# Patient Record
Sex: Male | Born: 1965 | Race: White | Hispanic: No | Marital: Married | Smoking: Never smoker
Health system: Southern US, Community
[De-identification: ages and names within clinical notes are randomized; demographics above are authoritative.]

## PROBLEM LIST (undated history)

## (undated) DIAGNOSIS — E039 Hypothyroidism, unspecified: Secondary | ICD-10-CM

## (undated) DIAGNOSIS — E079 Disorder of thyroid, unspecified: Secondary | ICD-10-CM

## (undated) HISTORY — PX: TONSILLECTOMY: SUR1361

## (undated) HISTORY — PX: ACHILLES TENDON REPAIR: SUR1153

## (undated) HISTORY — PX: APPENDECTOMY: SHX54

## (undated) HISTORY — PX: NASAL SEPTUM SURGERY: SHX37

## (undated) HISTORY — PX: SHOULDER SURGERY: SHX246

---

## 2014-10-22 ENCOUNTER — Emergency Department (HOSPITAL_BASED_OUTPATIENT_CLINIC_OR_DEPARTMENT_OTHER): Payer: Federal, State, Local not specified - PPO

## 2014-10-22 ENCOUNTER — Encounter (HOSPITAL_BASED_OUTPATIENT_CLINIC_OR_DEPARTMENT_OTHER): Payer: Self-pay | Admitting: Emergency Medicine

## 2014-10-22 ENCOUNTER — Emergency Department (HOSPITAL_BASED_OUTPATIENT_CLINIC_OR_DEPARTMENT_OTHER)
Admission: EM | Admit: 2014-10-22 | Discharge: 2014-10-22 | Disposition: A | Discharge status: Home / Self Care | Payer: Federal, State, Local not specified - PPO | Attending: Emergency Medicine | Admitting: Emergency Medicine

## 2014-10-22 DIAGNOSIS — T1490XA Injury, unspecified, initial encounter: Secondary | ICD-10-CM

## 2014-10-22 DIAGNOSIS — S80212A Abrasion, left knee, initial encounter: Secondary | ICD-10-CM | POA: Insufficient documentation

## 2014-10-22 DIAGNOSIS — E079 Disorder of thyroid, unspecified: Secondary | ICD-10-CM | POA: Diagnosis not present

## 2014-10-22 DIAGNOSIS — Y92410 Unspecified street and highway as the place of occurrence of the external cause: Secondary | ICD-10-CM | POA: Diagnosis not present

## 2014-10-22 DIAGNOSIS — S80211A Abrasion, right knee, initial encounter: Secondary | ICD-10-CM | POA: Insufficient documentation

## 2014-10-22 DIAGNOSIS — Y9389 Activity, other specified: Secondary | ICD-10-CM | POA: Insufficient documentation

## 2014-10-22 DIAGNOSIS — S4992XA Unspecified injury of left shoulder and upper arm, initial encounter: Secondary | ICD-10-CM | POA: Diagnosis present

## 2014-10-22 DIAGNOSIS — S43102A Unspecified dislocation of left acromioclavicular joint, initial encounter: Secondary | ICD-10-CM

## 2014-10-22 DIAGNOSIS — Y998 Other external cause status: Secondary | ICD-10-CM | POA: Insufficient documentation

## 2014-10-22 DIAGNOSIS — Z79899 Other long term (current) drug therapy: Secondary | ICD-10-CM | POA: Diagnosis not present

## 2014-10-22 DIAGNOSIS — Z880 Allergy status to penicillin: Secondary | ICD-10-CM | POA: Insufficient documentation

## 2014-10-22 HISTORY — DX: Disorder of thyroid, unspecified: E07.9

## 2014-10-22 MED ORDER — FENTANYL CITRATE (PF) 100 MCG/2ML IJ SOLN
50.0000 ug | Freq: Once | INTRAMUSCULAR | Status: AC
  Administered 2014-10-22: 50 ug via INTRAVENOUS
  Filled 2014-10-22: qty 2

## 2014-10-22 MED ORDER — IBUPROFEN 800 MG PO TABS: 800.0000 mg | ORAL_TABLET | Freq: Three times a day (TID) | ORAL | Status: AC

## 2014-10-22 NOTE — Discharge Instructions (Signed)
Acromioclavicular Separation with Rehab The acromioclavicular joint is the joint between the roof of the shoulder (acromion) and the collarbone (clavicle). It is vulnerable to injury. An acromioclavicular (AC) separation is a partial or complete tear (sprain), injury, or redness and soreness (inflammation) of the ligaments that cross the acromioclavicular joint and hold it in place. There are two ligaments in this area that are vulnerable to injury, the acromioclavicular ligament and the coracoclavicular ligament. SYMPTOMS   Tenderness and swelling, or a bump on top of the shoulder (at the AC joint).  Bruising (contusion) in the area within 48 hours of injury.  Loss of strength or pain when reaching over the head or across the body. CAUSES  AC separation is caused by direct trauma to the joint (falling on your shoulder) or indirect trauma (falling on an outstretched arm). RISK INCREASES WITH:  Sports that require contact or collision, throwing sports (i.e. racquetball, squash).  Poor strength and flexibility.  Previous shoulder sprain or dislocation.  Poorly fitted or padded protective equipment. PREVENTION   Warm-up and stretch properly before activity.  Maintain physical fitness:  Shoulder strength.  Shoulder flexibility.  Cardiovascular fitness.  Wear properly fitted and padded protective equipment.  Learn and use proper technique when playing sports. Have a coach correct improper technique, including falling and landing.  Apply taping, protective strapping or padding, or an adhesive bandage as recommended before practice or competition. PROGNOSIS   If treated properly, the symptoms of AC separation can be expected to go away.  If treated improperly, permanent disability may occur unless surgery is performed.  Healing time varies with type of sport and position, arm injured (dominant versus non-dominant) and severity of sprain. RELATED COMPLICATIONS  Weakness and  fatigue of the arm or shoulder are possible but uncommon.  Pain and inflammation of the AC joint may continue.  Prolonged healing time may be necessary if usual activities are resumed too early. This causes a susceptibility to recurrent injury.  Prolonged disability may occur.  The shoulder may remain unstable or arthritic following repeated injury. TREATMENT  Treatment initially involves ice and medication to help reduce pain and inflammation. It may also be necessary to modify your activities in order to prevent further injury. Both non-surgical and surgical interventions exist to treat AC separation. Non-surgical intervention is usually recommended and involves wearing a sling to immobilize the joint for a period of time to allow for healing. Surgical intervention is usually only considered for severe sprains of the ligament or for individuals who do not improve after 2 to 6 months of non-surgical treatment. Surgical interventions require 4 to 6 months before a return to sports is possible. MEDICATION  If pain medication is necessary, nonsteroidal anti-inflammatory medications, such as aspirin and ibuprofen, or other minor pain relievers, such as acetaminophen, are often recommended.  Do not take pain medication for 7 days before surgery.  Prescription pain relievers may be given by your caregiver. Use only as directed and only as much as you need.  Ointments applied to the skin may be helpful.  Corticosteroid injections may be given to reduce inflammation. HEAT AND COLD  Cold treatment (icing) relieves pain and reduces inflammation. Cold treatment should be applied for 10 to 15 minutes every 2 to 3 hours for inflammation and pain and immediately after any activity that aggravates your symptoms. Use ice packs or an ice massage.  Heat treatment may be used prior to performing the stretching and strengthening activities prescribed by your caregiver, physical therapist or   athletic  trainer. Use a heat pack or a warm soak. SEEK IMMEDIATE MEDICAL CARE IF:   Pain, swelling or bruising worsens despite treatment.  There is pain, numbness or coldness in the arm.  Discoloration appears in the fingernails.  New, unexplained symptoms develop. EXERCISES  RANGE OF MOTION (ROM) AND STRETCHING EXERCISES - Acromioclavicular Separation These exercises may help you when beginning to rehabilitate your injury. Your symptoms may resolve with or without further involvement from your physician, physical therapist or athletic trainer. While completing these exercises, remember:  Restoring tissue flexibility helps normal motion to return to the joints. This allows healthier, less painful movement and activity.  An effective stretch should be held for at least 30 seconds.  A stretch should never be painful. You should only feel a gentle lengthening or release in the stretched tissue. ROM - Pendulum  Bend at the waist so that your right / left arm falls away from your body. Support yourself with your opposite hand on a solid surface, such as a table or a countertop.  Your right / left arm should be perpendicular to the ground. If it is not perpendicular, you need to lean over farther. Relax the muscles in your right / left arm and shoulder as much as possible.  Gently sway your hips and trunk so they move your right / left arm without any use of your right / left shoulder muscles.  Progress your movements so that your right / left arm moves side to side, then forward and backward, and finally, both clockwise and counterclockwise.  Complete __________ repetitions in each direction. Many people use this exercise to relieve discomfort in their shoulder as well as to gain range of motion. Repeat __________ times. Complete this exercise __________ times per day. STRETCH - Flexion, Seated   Sit in a firm chair so that your right / left forearm can rest on a table or countertop. Your right /  left elbow should rest below the height of your shoulder so that your shoulder feels supported and not tense or uncomfortable.  Keeping your right / left shoulder relaxed, lean forward at your waist, allowing your right / left hand to slide forward. Bend forward until you feel a moderate stretch in your shoulder, but before you feel an increase in your pain.  Hold __________ seconds. Slowly return to your starting position. Repeat __________ times. Complete this exercise __________ times per day. STRETCH - Flexion, Standing  Stand with good posture. With an underhand grip on your right / left and an overhand grip on the opposite hand, grasp a broomstick or cane so that your hands are a little more than shoulder-width apart.  Keeping your right / left elbow straight and shoulder muscles relaxed, push the stick with your opposite hand to raise your right / left arm in front of your body and then overhead. Raise your arm until you feel a stretch in your right / left shoulder, but before you have increased shoulder pain.  Try to avoid shrugging your right / left shoulder as your arm rises by keeping your shoulder blade tucked down and toward your mid-back spine. Hold __________ seconds.  Slowly return to the starting position. Repeat __________ times. Complete this exercise __________ times per day. STRENGTHENING EXERCISES - Acromioclavicular Separation These exercises may help you when beginning to rehabilitate your injury. They may resolve your symptoms with or without further involvement from your physician, physical therapist or athletic trainer. While completing these exercises, remember:  Muscles   can gain both the endurance and the strength needed for everyday activities through controlled exercises.  Complete these exercises as instructed by your physician, physical therapist or athletic trainer. Progress the resistance and repetitions only as guided.  You may experience muscle soreness or  fatigue, but the pain or discomfort you are trying to eliminate should never worsen during these exercises. If this pain does worsen, stop and make certain you are following the directions exactly. If the pain is still present after adjustments, discontinue the exercise until you can discuss the trouble with your clinician. STRENGTH - Shoulder Abductors, Isometric   With good posture, stand or sit about 4-6 inches from a wall with your right / left side facing the wall.  Bend your right / left elbow. Gently press your right / left elbow into the wall. Increase the pressure gradually until you are pressing as hard as you can without shrugging your shoulder or increasing any shoulder discomfort.  Hold __________ seconds.  Release the tension slowly. Relax your shoulder muscles completely before you start the next repetition. Repeat __________ times. Complete this exercise __________ times per day. STRENGTH - Internal Rotators, Isometric  Keep your right / left elbow at your side and bend it 90 degrees.  Step into a door frame so that the inside of your right / left wrist can press against the door frame without your upper arm leaving your side.  Gently press your right / left wrist into the door frame as if you were trying to draw the palm of your hand to your abdomen. Gradually increase the tension until you are pressing as hard as you can without shrugging your shoulder or increasing any shoulder discomfort.  Hold __________ seconds.  Release the tension slowly. Relax your shoulder muscles completely before you the next repetition. Repeat __________ times. Complete this exercise __________ times per day.  STRENGTH - External Rotators, Isometric  Keep your right / left elbow at your side and bend it 90 degrees.  Step into a door frame so that the outside of your right / left wrist can press against the door frame without your upper arm leaving your side.  Gently press your right / left  wrist into the door frame as if you were trying to swing the back of your hand away from your abdomen. Gradually increase the tension until you are pressing as hard as you can without shrugging your shoulder or increasing any shoulder discomfort.  Hold __________ seconds.  Release the tension slowly. Relax your shoulder muscles completely before you the next repetition. Repeat __________ times. Complete this exercise __________ times per day. STRENGTH - Internal Rotators  Secure a rubber exercise band/tubing to a fixed object so that it is at the same height as your right / left elbow when you are standing or sitting on a firm surface.  Stand or sit so that the secured exercise band/tubing is at your right / left side.  Bend your elbow 90 degrees. Place a folded towel or small pillow under your right / left arm so that your elbow is a few inches away from your side.  Keeping the tension on the exercise band/tubing, pull it across your body toward your abdomen. Be sure to keep your body steady so that the movement is only coming from your shoulder rotating.  Hold __________ seconds. Release the tension in a controlled manner as you return to the starting position. Repeat __________ times. Complete this exercise __________ times per day. STRENGTH -   External Rotators  Secure a rubber exercise band/tubing to a fixed object so that it is at the same height as your right / left elbow when you are standing or sitting on a firm surface.  Stand or sit so that the secured exercise band/tubing is at your side that is not injured.  Bend your elbow 90 degrees. Place a folded towel or small pillow under your right / left arm so that your elbow is a few inches away from your side.  Keeping the tension on the exercise band/tubing, pull it away from your body, as if pivoting on your elbow. Be sure to keep your body steady so that the movement is only coming from your shoulder rotating.  Hold __________  seconds. Release the tension in a controlled manner as you return to the starting position. Repeat __________ times. Complete this exercise __________ times per day. Document Released: 01/28/2005 Document Revised: 04/22/2011 Document Reviewed: 05/12/2008 [employer] Patient Information 2015 [employer]. This information is not intended to replace advice given to you by your health care provider. Make sure you discuss any questions you have with your health care provider.  
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
## 2015-01-25 ENCOUNTER — Encounter (HOSPITAL_COMMUNITY): Payer: Self-pay | Admitting: *Deleted

## 2015-01-25 MED ORDER — CEFAZOLIN SODIUM-DEXTROSE 2-3 GM-% IV SOLR
2.0000 g | INTRAVENOUS | Status: AC
  Administered 2015-01-26: 2 g via INTRAVENOUS
  Filled 2015-01-25: qty 50

## 2015-01-25 NOTE — Progress Notes (Signed)
Pt denies cardiac history, chest pain or sob. 

## 2015-01-26 ENCOUNTER — Ambulatory Visit (HOSPITAL_COMMUNITY): Payer: Federal, State, Local not specified - PPO | Admitting: Certified Registered Nurse Anesthetist

## 2015-01-26 ENCOUNTER — Ambulatory Visit (HOSPITAL_COMMUNITY)
Admission: RE | Admit: 2015-01-26 | Discharge: 2015-01-26 | Disposition: A | Discharge status: Home / Self Care | Payer: Federal, State, Local not specified - PPO | Source: Ambulatory Visit | Attending: Orthopedic Surgery | Admitting: Orthopedic Surgery

## 2015-01-26 ENCOUNTER — Encounter (HOSPITAL_COMMUNITY): Payer: Self-pay | Admitting: *Deleted

## 2015-01-26 ENCOUNTER — Encounter (HOSPITAL_COMMUNITY)
Admission: RE | Disposition: A | Discharge status: Home / Self Care | Payer: Self-pay | Source: Ambulatory Visit | Attending: Orthopedic Surgery

## 2015-01-26 DIAGNOSIS — Z880 Allergy status to penicillin: Secondary | ICD-10-CM | POA: Diagnosis not present

## 2015-01-26 DIAGNOSIS — E039 Hypothyroidism, unspecified: Secondary | ICD-10-CM | POA: Diagnosis not present

## 2015-01-26 DIAGNOSIS — T8189XA Other complications of procedures, not elsewhere classified, initial encounter: Secondary | ICD-10-CM | POA: Diagnosis present

## 2015-01-26 DIAGNOSIS — Y838 Other surgical procedures as the cause of abnormal reaction of the patient, or of later complication, without mention of misadventure at the time of the procedure: Secondary | ICD-10-CM | POA: Insufficient documentation

## 2015-01-26 DIAGNOSIS — Z79899 Other long term (current) drug therapy: Secondary | ICD-10-CM | POA: Insufficient documentation

## 2015-01-26 HISTORY — PX: SCAR REVISION: SHX5285

## 2015-01-26 HISTORY — DX: Hypothyroidism, unspecified: E03.9

## 2015-01-26 LAB — CBC WITH DIFFERENTIAL/PLATELET
BASOS ABS: 0 10*3/uL (ref 0.0–0.1)
BASOS PCT: 1 %
EOS ABS: 0.2 10*3/uL (ref 0.0–0.7)
EOS PCT: 3 %
HCT: 42.1 % (ref 39.0–52.0)
Hemoglobin: 14.1 g/dL (ref 13.0–17.0)
Lymphocytes Relative: 27 %
Lymphs Abs: 1.6 10*3/uL (ref 0.7–4.0)
MCH: 28.7 pg (ref 26.0–34.0)
MCHC: 33.5 g/dL (ref 30.0–36.0)
MCV: 85.6 fL (ref 78.0–100.0)
MONO ABS: 0.5 10*3/uL (ref 0.1–1.0)
Monocytes Relative: 8 %
Neutro Abs: 3.5 10*3/uL (ref 1.7–7.7)
Neutrophils Relative %: 61 %
PLATELETS: 246 10*3/uL (ref 150–400)
RBC: 4.92 MIL/uL (ref 4.22–5.81)
RDW: 12.7 % (ref 11.5–15.5)
WBC: 5.8 10*3/uL (ref 4.0–10.5)

## 2015-01-26 LAB — COMPREHENSIVE METABOLIC PANEL
ALT: 26 U/L (ref 17–63)
ANION GAP: 7 (ref 5–15)
AST: 29 U/L (ref 15–41)
Albumin: 3.7 g/dL (ref 3.5–5.0)
Alkaline Phosphatase: 66 U/L (ref 38–126)
BILIRUBIN TOTAL: 1.2 mg/dL (ref 0.3–1.2)
BUN: 14 mg/dL (ref 6–20)
CO2: 25 mmol/L (ref 22–32)
Calcium: 9.1 mg/dL (ref 8.9–10.3)
Chloride: 105 mmol/L (ref 101–111)
Creatinine, Ser: 1.15 mg/dL (ref 0.61–1.24)
GLUCOSE: 91 mg/dL (ref 65–99)
POTASSIUM: 4.2 mmol/L (ref 3.5–5.1)
SODIUM: 137 mmol/L (ref 135–145)
TOTAL PROTEIN: 6.8 g/dL (ref 6.5–8.1)

## 2015-01-26 LAB — APTT: APTT: 28 s (ref 24–37)

## 2015-01-26 LAB — PROTIME-INR
INR: 1.03 (ref 0.00–1.49)
PROTHROMBIN TIME: 13.7 s (ref 11.6–15.2)

## 2015-01-26 SURGERY — REVISION, SCAR
Anesthesia: General | Site: Shoulder | Laterality: Left

## 2015-01-26 MED ORDER — ONDANSETRON HCL 4 MG/2ML IJ SOLN
INTRAMUSCULAR | Status: AC
  Filled 2015-01-26: qty 2

## 2015-01-26 MED ORDER — 0.9 % SODIUM CHLORIDE (POUR BTL) OPTIME
TOPICAL | Status: DC | PRN
  Administered 2015-01-26: 1000 mL

## 2015-01-26 MED ORDER — MIDAZOLAM HCL 5 MG/5ML IJ SOLN
INTRAMUSCULAR | Status: DC | PRN
  Administered 2015-01-26: 2 mg via INTRAVENOUS

## 2015-01-26 MED ORDER — LACTATED RINGERS IV SOLN
INTRAVENOUS | Status: DC
  Administered 2015-01-26: 09:00:00 via INTRAVENOUS

## 2015-01-26 MED ORDER — CEPHALEXIN 500 MG PO CAPS: 500.0000 mg | ORAL_CAPSULE | Freq: Three times a day (TID) | ORAL | Status: AC

## 2015-01-26 MED ORDER — ONDANSETRON HCL 4 MG/2ML IJ SOLN
INTRAMUSCULAR | Status: DC | PRN
  Administered 2015-01-26: 4 mg via INTRAVENOUS

## 2015-01-26 MED ORDER — HYDROMORPHONE HCL 1 MG/ML IJ SOLN: 0.2500 mg | INTRAMUSCULAR | Status: DC | PRN

## 2015-01-26 MED ORDER — ROCURONIUM BROMIDE 50 MG/5ML IV SOLN
INTRAVENOUS | Status: AC
  Filled 2015-01-26: qty 1

## 2015-01-26 MED ORDER — LIDOCAINE HCL (CARDIAC) 20 MG/ML IV SOLN
INTRAVENOUS | Status: DC | PRN
  Administered 2015-01-26: 70 mg via INTRAVENOUS

## 2015-01-26 MED ORDER — PHENYLEPHRINE 40 MCG/ML (10ML) SYRINGE FOR IV PUSH (FOR BLOOD PRESSURE SUPPORT)
PREFILLED_SYRINGE | INTRAVENOUS | Status: AC
  Filled 2015-01-26: qty 10

## 2015-01-26 MED ORDER — MEPERIDINE HCL 25 MG/ML IJ SOLN: 6.2500 mg | INTRAMUSCULAR | Status: DC | PRN

## 2015-01-26 MED ORDER — CHLORHEXIDINE GLUCONATE 4 % EX LIQD: 60.0000 mL | Freq: Once | CUTANEOUS | Status: DC

## 2015-01-26 MED ORDER — BUPIVACAINE HCL (PF) 0.25 % IJ SOLN
INTRAMUSCULAR | Status: DC | PRN
  Administered 2015-01-26: 9 mL

## 2015-01-26 MED ORDER — LIDOCAINE HCL (CARDIAC) 20 MG/ML IV SOLN
INTRAVENOUS | Status: AC
  Filled 2015-01-26: qty 5

## 2015-01-26 MED ORDER — PROPOFOL 10 MG/ML IV BOLUS
INTRAVENOUS | Status: AC
  Filled 2015-01-26: qty 20

## 2015-01-26 MED ORDER — SUCCINYLCHOLINE CHLORIDE 20 MG/ML IJ SOLN
INTRAMUSCULAR | Status: DC | PRN
  Administered 2015-01-26: 80 mg via INTRAVENOUS

## 2015-01-26 MED ORDER — HYDROCODONE-ACETAMINOPHEN 5-325 MG PO TABS: 1.0000 | ORAL_TABLET | ORAL | Status: AC | PRN

## 2015-01-26 MED ORDER — PROPOFOL 10 MG/ML IV BOLUS
INTRAVENOUS | Status: DC | PRN
  Administered 2015-01-26: 140 mg via INTRAVENOUS
  Administered 2015-01-26: 20 mg via INTRAVENOUS

## 2015-01-26 MED ORDER — MIDAZOLAM HCL 2 MG/2ML IJ SOLN
INTRAMUSCULAR | Status: AC
  Filled 2015-01-26: qty 2

## 2015-01-26 MED ORDER — LACTATED RINGERS IV SOLN
INTRAVENOUS | Status: DC | PRN
  Administered 2015-01-26 (×2): via INTRAVENOUS

## 2015-01-26 MED ORDER — FENTANYL CITRATE (PF) 250 MCG/5ML IJ SOLN
INTRAMUSCULAR | Status: AC
  Filled 2015-01-26: qty 5

## 2015-01-26 MED ORDER — BUPIVACAINE HCL (PF) 0.25 % IJ SOLN
INTRAMUSCULAR | Status: AC
  Filled 2015-01-26: qty 30

## 2015-01-26 MED ORDER — FENTANYL CITRATE (PF) 250 MCG/5ML IJ SOLN
INTRAMUSCULAR | Status: DC | PRN
  Administered 2015-01-26: 150 ug via INTRAVENOUS
  Administered 2015-01-26: 50 ug via INTRAVENOUS

## 2015-01-26 MED ORDER — ONDANSETRON HCL 4 MG/2ML IJ SOLN: 4.0000 mg | Freq: Once | INTRAMUSCULAR | Status: DC | PRN

## 2015-01-26 SURGICAL SUPPLY — 54 items
CLOSURE WOUND 1/2 X4 (GAUZE/BANDAGES/DRESSINGS)
CONT SPEC 4OZ CLIKSEAL STRL BL (MISCELLANEOUS) ×3 IMPLANT
COVER SURGICAL LIGHT HANDLE (MISCELLANEOUS) ×3 IMPLANT
DRAPE C-ARM 42X72 X-RAY (DRAPES) ×3 IMPLANT
DRAPE INCISE IOBAN 66X45 STRL (DRAPES) ×3 IMPLANT
DRAPE ORTHO SPLIT 77X108 STRL (DRAPES) ×6
DRAPE POUCH INSTRU U-SHP 10X18 (DRAPES) ×3 IMPLANT
DRAPE SURG ORHT 6 SPLT 77X108 (DRAPES) ×3 IMPLANT
DRAPE U-SHAPE 47X51 STRL (DRAPES) ×3 IMPLANT
DRSG EMULSION OIL 3X3 NADH (GAUZE/BANDAGES/DRESSINGS) IMPLANT
DRSG MEPILEX BORDER 4X8 (GAUZE/BANDAGES/DRESSINGS) ×3 IMPLANT
DRSG PAD ABDOMINAL 8X10 ST (GAUZE/BANDAGES/DRESSINGS) ×3 IMPLANT
ELECT REM PT RETURN 9FT ADLT (ELECTROSURGICAL) ×3
ELECTRODE REM PT RTRN 9FT ADLT (ELECTROSURGICAL) ×1 IMPLANT
GAUZE SPONGE 4X4 12PLY STRL (GAUZE/BANDAGES/DRESSINGS) ×3 IMPLANT
GLOVE BIO SURGEON STRL SZ7.5 (GLOVE) ×3 IMPLANT
GLOVE BIO SURGEON STRL SZ8 (GLOVE) ×3 IMPLANT
GLOVE EUDERMIC 7 POWDERFREE (GLOVE) ×3 IMPLANT
GLOVE SS BIOGEL STRL SZ 7.5 (GLOVE) ×1 IMPLANT
GLOVE SUPERSENSE BIOGEL SZ 7.5 (GLOVE) ×2
GOWN STRL REUS W/ TWL LRG LVL3 (GOWN DISPOSABLE) ×1 IMPLANT
GOWN STRL REUS W/ TWL XL LVL3 (GOWN DISPOSABLE) ×2 IMPLANT
GOWN STRL REUS W/TWL LRG LVL3 (GOWN DISPOSABLE) ×2
GOWN STRL REUS W/TWL XL LVL3 (GOWN DISPOSABLE) ×4
KIT BASIN OR (CUSTOM PROCEDURE TRAY) IMPLANT
KIT ROOM TURNOVER OR (KITS) ×3 IMPLANT
MANIFOLD NEPTUNE II (INSTRUMENTS) ×3 IMPLANT
NDL SUT 6 .5 CRC .975X.05 MAYO (NEEDLE) ×1 IMPLANT
NEEDLE 22X1 1/2 (OR ONLY) (NEEDLE) IMPLANT
NEEDLE MAYO TAPER (NEEDLE) ×2
NS IRRIG 1000ML POUR BTL (IV SOLUTION) ×3 IMPLANT
PACK SHOULDER (CUSTOM PROCEDURE TRAY) IMPLANT
PAD ARMBOARD 7.5X6 YLW CONV (MISCELLANEOUS) ×6 IMPLANT
SPONGE GAUZE 4X4 12PLY STER LF (GAUZE/BANDAGES/DRESSINGS) ×3 IMPLANT
SPONGE LAP 4X18 X RAY DECT (DISPOSABLE) ×6 IMPLANT
STRIP CLOSURE SKIN 1/2X4 (GAUZE/BANDAGES/DRESSINGS) IMPLANT
SUCTION FRAZIER TIP 10 FR DISP (SUCTIONS) ×3 IMPLANT
SUT BONE WAX W31G (SUTURE) IMPLANT
SUT ETHIBOND NAB CT1 #1 30IN (SUTURE) ×3 IMPLANT
SUT ETHILON 2 0 FS 18 (SUTURE) ×6 IMPLANT
SUT FIBERWIRE #2 38 T-5 BLUE (SUTURE)
SUT MNCRL AB 3-0 PS2 18 (SUTURE) ×3 IMPLANT
SUT VIC AB 0 CT1 27 (SUTURE) ×2
SUT VIC AB 0 CT1 27XBRD ANBCTR (SUTURE) ×1 IMPLANT
SUT VIC AB 2-0 CT1 27 (SUTURE) ×4
SUT VIC AB 2-0 CT1 TAPERPNT 27 (SUTURE) ×2 IMPLANT
SUT VICRYL 4-0 PS2 18IN ABS (SUTURE) ×3 IMPLANT
SUTURE FIBERWR #2 38 T-5 BLUE (SUTURE) IMPLANT
SWAB COLLECTION DEVICE MRSA (MISCELLANEOUS) ×3 IMPLANT
SYR CONTROL 10ML LL (SYRINGE) ×3 IMPLANT
TOWEL OR 17X24 6PK STRL BLUE (TOWEL DISPOSABLE) ×3 IMPLANT
TOWEL OR 17X26 10 PK STRL BLUE (TOWEL DISPOSABLE) ×3 IMPLANT
WATER STERILE IRR 1000ML POUR (IV SOLUTION) ×3 IMPLANT
YANKAUER SUCT BULB TIP NO VENT (SUCTIONS) IMPLANT

## 2015-01-26 NOTE — Progress Notes (Signed)
Patient and wife requested copy of consent form, Jaci LazierCindy Rizzo came to bedside and approved copy of consent form.  Consent copy given to wife per patient request.

## 2015-01-26 NOTE — Progress Notes (Signed)
Dr Rennis ChrisSupple here to speak w/pt & spouse, questions answered.

## 2015-01-26 NOTE — Op Note (Signed)
NAMEENZO, [name]:  [id number]  MEDICAL RECORD NO.:  [id number]  LOCATION:  [facility]                         FACILITY:  [facility]  PHYSICIAN:  Vania Rea. Angelie Kram, M.D.  DATE OF BIRTH:  Mar 19, 1965  DATE OF PROCEDURE:  01/26/2015 DATE OF DISCHARGE:  01/26/2015                              OPERATIVE REPORT   PREOPERATIVE DIAGNOSIS:  Left shoulder incision with delayed healing.  POSTOPERATIVE DIAGNOSIS:  Left shoulder incision with delayed healing with finding of inflammatory reaction around retained suture material and allograft.  PROCEDURE:  Left shoulder scar revision with debridement of subcutaneous retained suture and allograft.  SURGEON:  Vania Rea. Abbi Mancini, M.D.  Threasa HeadsFrench Ana A. Shuford, P.A.-C.  ANESTHESIA:  General endotracheal.  ESTIMATED BLOOD LOSS:  Minimal.  SPECIMENS:  We took a portion of tissue and swab of the deep tissue, tissue planes and sent off for routine culture.  DRAINS:  None.  HISTORY:  Joseph Russell is a 49 year old gentleman who has had a recent left shoulder AC joint reconstruction that performed almost 2 months ago.  He has been doing well clinically, but has had continued intermittent drainage as well as some granulation tissue which has developed over the central aspect of the shoulder incision.  This has failed to heal after treatments with local wound care and silver nitrate.  There did appear to be some increasing drainage recently and due to the persistent failure of complete healing, he is brought to the operating room at this time for planned excision of the sinus tract and granulation tissue, planned debridement and delayed primary closure.  Preoperatively, I counseled Joseph Russell regarding treatment options and potential risks versus benefits thereof.  Possible surgical complications were reviewed including bleeding, infection, vascular injury as well as persistent pain, recurrence of instability,  anesthetic complication, possible need for additional surgery.  He understands and accepts and agrees with plan.  PROCEDURE IN DETAIL:  After undergoing routine preop evaluation, the patient received prophylactic antibiotics, so we elected to hold these until we obtain a culture.  He did not have an interscalene block.  The patient was brought to the operating room, placed supine on the operating table and smooth induction of a general endotracheal anesthesia, placed in beach-chair position and appropriately padded and protected.  Left shoulder girdle region was sterilely prepped and draped in standard fashion.  Time-out was called.  I made an elliptical excision of the area of granulation tissue with total length of the incision 2 cm, and the tissue within this area including the sinus tract and granulation tissue was excised as a single piece.  Then dissected deeply and found an area of deeper granulation tissue which we removed with a rongeur placing into the culturette tube and also used the culture swab to swab this area where the granulation tissue appeared to have been originating and accumulating.  This set off for routine culture.  On deeper inspection, we found that the limbs of the allograft and the associated sutures that had been whipstitched to the ends of allograft had become quite prominent and appeared to be the source of the irritation of the tissues.  We mechanically debrided and sharply excised the allograft  tissue which later across the apex of the clavicle.  We confirmed the construct remained intact with the button in good position and good stability of the distal clavicle.  We ultimately excised all of the allograft and then traversing across the dorsal aspect of the clavicle and removed all of the FiberWire sutures that were placed in the area as well.  At this point, the wound was then copiously irrigated.  Hemostasis was obtained.  I mobilized the skin flaps  and underlying soft tissues such that we could gain a tension-free skin closure and placed a series of 3 vertical mattress 2-0 nylon sutures.  Instilled 0.5% plain Marcaine along the skin edges.  Dry dressing was applied.  Left arm was placed in a sling.  The patient was awakened, extubated, and taken to recovery room in stable condition.     Vania ReaKevin M. Isaih Bulger, M.D.     KMS/MEDQ  D:  01/26/2015  T:  01/26/2015  Job:  [id number]

## 2015-01-26 NOTE — Anesthesia Postprocedure Evaluation (Signed)
Anesthesia Post Note  Patient: Bud FaceJerome Bassford  Procedure(s) Performed: Procedure(s) (LRB): LEFT SHOULDER SCAR REVISION (Left)  Patient location during evaluation: PACU Anesthesia Type: General Level of consciousness: awake and alert Pain management: pain level controlled Vital Signs Assessment: post-procedure vital signs reviewed and stable Respiratory status: spontaneous breathing, nonlabored ventilation, respiratory function stable and patient connected to nasal cannula oxygen Cardiovascular status: blood pressure returned to baseline and stable Postop Assessment: no signs of nausea or vomiting Anesthetic complications: no    Last Vitals:  Filed Vitals:   01/26/15 1317 01/26/15 1324  BP:  133/70  Pulse: 56   Temp: 36.6 C   Resp: 15 18    Last Pain:  Filed Vitals:   01/26/15 1326  PainSc: 0-No pain                 Bassheva Flury DAVID

## 2015-01-26 NOTE — Op Note (Signed)
01/26/2015  12:51 PM  PATIENT:   Joseph Russell  49 y.o. male  PRE-OPERATIVE DIAGNOSIS:  LEFT SHOULDER WOUND Delayed healing  POST-OPERATIVE DIAGNOSIS:  same  PROCEDURE:  Scar revision and debridement left shoulder incision  SURGEON:  Corina Stacy, Vania ReaKevin M. M.D.  ASSISTANTS: Shuford pac   ANESTHESIA:   GET + ISB  EBL: min  SPECIMEN:  Tissue and swab for routine culture  Drains: none   PATIENT DISPOSITION:  PACU - hemodynamically stable.    PLAN OF CARE: Discharge to home after PACU  Dictation# [id number]   Contact # [phone]

## 2015-01-26 NOTE — H&P (Signed)
Joseph FaceJerome Worrall    Chief Complaint: LEFT SHOULDER WOUND DRAINAGE  HPI: The patient is a 49 y.o. male with delayed healing of left shoulder incision  Past Medical History  Diagnosis Date  . Thyroid disease   . Hypothyroidism     Past Surgical History  Procedure Laterality Date  . Appendectomy    . Nasal septum surgery    . Tonsillectomy    . Achilles tendon repair Right   . Shoulder surgery Left     Family History  Problem Relation Age of Onset  . Healthy Mother   . Heart disease Father   . Hypertension Father   . Throat cancer Father     Social History:  reports that he has never smoked. He has never used smokeless tobacco. He reports that he drinks alcohol. He reports that he does not use illicit drugs.  Allergies:  Allergies  Allergen Reactions  . Penicillins Rash    Medications Prior to Admission  Medication Sig Dispense Refill  . ibuprofen (ADVIL,MOTRIN) 800 MG tablet Take 1 tablet (800 mg total) by mouth 3 (three) times daily. (Patient taking differently: Take 800 mg by mouth every 6 (six) hours as needed. ) 21 tablet 0  . levothyroxine (SYNTHROID, LEVOTHROID) 137 MCG tablet Take 137 mcg by mouth daily before breakfast.    . Multiple Vitamin (MULTIVITAMIN) capsule Take 1 capsule by mouth daily.    . Omega-3 Fatty Acids (FISH OIL PO) Take 1 capsule by mouth daily.        Physical Exam: left shoulder with granulation tissue and scant serous drainage from proximal aspect of incision, adherence of skin to underlying tissues.  Vitals  Temp:  [97.6 F (36.4 C)] 97.6 F (36.4 C) (12/15 0835) Pulse Rate:  [52] 52 (12/15 0835) Resp:  [16] 16 (12/15 0835) BP: (133)/(89) 133/89 mmHg (12/15 0835) SpO2:  [100 %] 100 % (12/15 0835) Weight:  [90.719 kg (200 lb)] 90.719 kg (200 lb) (12/15 0835)  Assessment/Plan  Impression: LEFT SHOULDER WOUND DRAINAGE and delayed healing  Plan of Action: Procedure(s): LEFT SHOULDER SCAR REVISION  Stephon Weathers M Isair Inabinet 01/26/2015,  11:19 AM Contact # [phone]

## 2015-01-26 NOTE — Anesthesia Procedure Notes (Signed)
Procedure Name: Intubation Date/Time: 01/26/2015 11:58 AM Performed by: Rogelia BogaMUELLER, Griselda Bramblett P Pre-anesthesia Checklist: Patient identified, Emergency Drugs available, Suction available, Patient being monitored and Timeout performed Patient Re-evaluated:Patient Re-evaluated prior to inductionOxygen Delivery Method: Circle system utilized Preoxygenation: Pre-oxygenation with 100% oxygen Intubation Type: IV induction Ventilation: Mask ventilation without difficulty Laryngoscope Size: Mac and 4 Grade View: Grade II Tube type: Oral Tube size: 7.5 mm Number of attempts: 1 Airway Equipment and Method: Stylet Placement Confirmation: ETT inserted through vocal cords under direct vision,  positive ETCO2 and breath sounds checked- equal and bilateral Secured at: 22 cm Tube secured with: Tape Dental Injury: Teeth and Oropharynx as per pre-operative assessment

## 2015-01-26 NOTE — Transfer of Care (Signed)
Immediate Anesthesia Transfer of Care Note  Patient: Joseph Russell  Procedure(s) Performed: Procedure(s): LEFT SHOULDER SCAR REVISION (Left)  Patient Location: PACU  Anesthesia Type:General  Level of Consciousness: awake, alert , oriented and patient cooperative  Airway & Oxygen Therapy: Patient Spontanous Breathing and Patient connected to nasal cannula oxygen  Post-op Assessment: Report given to RN, Post -op Vital signs reviewed and stable and Patient moving all extremities X 4  Post vital signs: Reviewed and stable  Last Vitals:  Filed Vitals:   01/26/15 0835 01/26/15 1301  BP: 133/89   Pulse: 52 65  Temp: 36.4 C 36.6 C  Resp: 16 14    Complications: No apparent anesthesia complications

## 2015-01-26 NOTE — Anesthesia Preprocedure Evaluation (Addendum)
Anesthesia Evaluation  Patient identified by MRN, date of birth, ID band Patient awake    Reviewed: Allergy & Precautions, NPO status , Patient's Chart, lab work & pertinent test results  Airway Mallampati: II  TM Distance: >3 FB Neck ROM: Full    Dental  (+) Teeth Intact, Dental Advisory Given   Pulmonary    Pulmonary exam normal        Cardiovascular Normal cardiovascular exam     Neuro/Psych    GI/Hepatic   Endo/Other  Hypothyroidism   Renal/GU      Musculoskeletal   Abdominal   Peds  Hematology   Anesthesia Other Findings   Reproductive/Obstetrics                            Anesthesia Physical Anesthesia Plan  ASA: II  Anesthesia Plan: General   Post-op Pain Management:    Induction: Intravenous  Airway Management Planned: LMA  Additional Equipment:   Intra-op Plan:   Post-operative Plan: Extubation in OR  Informed Consent: I have reviewed the patients History and Physical, chart, labs and discussed the procedure including the risks, benefits and alternatives for the proposed anesthesia with the patient or authorized representative who has indicated his/her understanding and acceptance.   Dental advisory given  Plan Discussed with: CRNA, Surgeon and Anesthesiologist  Anesthesia Plan Comments:        Anesthesia Quick Evaluation

## 2015-01-26 NOTE — Discharge Instructions (Signed)
Ok for light use of left arm. Sling for comfort Keep incision dry until we see you Monday Ok to change dressing on Saturday, then daily as needed

## 2015-01-27 ENCOUNTER — Encounter (HOSPITAL_COMMUNITY): Payer: Self-pay | Admitting: Orthopedic Surgery

## 2015-01-31 LAB — TISSUE CULTURE: GRAM STAIN: NONE SEEN

## 2015-02-10 LAB — ANAEROBIC CULTURE: Gram Stain: NONE SEEN

## 2015-03-10 LAB — AFB CULTURE WITH SMEAR (NOT AT ARMC)
ACID FAST SMEAR: NONE SEEN
Acid Fast Smear: NONE SEEN
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
## 2019-04-02 ENCOUNTER — Ambulatory Visit: Payer: Federal, State, Local not specified - PPO | Attending: Internal Medicine

## 2019-04-02 DIAGNOSIS — Z23 Encounter for immunization: Secondary | ICD-10-CM

## 2019-04-02 NOTE — Progress Notes (Signed)
   Covid-19 Vaccination Clinic  Name:  Cordelro Gautreau    MRN: [id number] DOB: March 09, 1965  04/02/2019  Mr. Dunshee was observed post Covid-19 immunization for 15 minutes without incidence. He was provided with Vaccine Information Sheet and instruction to access the V-Safe system.   Mr. Kluever was instructed to call 911 with any severe reactions post vaccine: Marland Kitchen Difficulty breathing  . Swelling of your face and throat  . A fast heartbeat  . A bad rash all over your body  . Dizziness and weakness    Immunizations Administered    Name Date Dose VIS Date Route   Pfizer COVID-19 Vaccine 04/02/2019  3:59 PM 0.3 mL 01/22/2019 Intramuscular   Manufacturer: [employer], [employer]   Lot: [id number]   NDC: [address]-0

## 2019-04-27 ENCOUNTER — Ambulatory Visit: Payer: Federal, State, Local not specified - PPO | Attending: Internal Medicine

## 2019-04-27 DIAGNOSIS — Z23 Encounter for immunization: Secondary | ICD-10-CM

## 2019-04-27 NOTE — Progress Notes (Signed)
   Covid-19 Vaccination Clinic  Name:  Khairi Garman    MRN: [id number] DOB: 1965-04-16  04/27/2019  Mr. Losito was observed post Covid-19 immunization for 15 minutes without incident. He was provided with Vaccine Information Sheet and instruction to access the V-Safe system.   Mr. Buresh was instructed to call 911 with any severe reactions post vaccine: Marland Kitchen Difficulty breathing  . Swelling of face and throat  . A fast heartbeat  . A bad rash all over body  . Dizziness and weakness   Immunizations Administered    Name Date Dose VIS Date Route   Pfizer COVID-19 Vaccine 04/27/2019  8:29 AM 0.3 mL 01/22/2019 Intramuscular   Manufacturer: [employer], [employer]   Lot: [id number]   NDC: [address]-4
